# Patient Record
Sex: Female | Born: 1995 | Race: White | Hispanic: No | Marital: Single | Smoking: Never smoker
Health system: Southern US, Community
[De-identification: ages and names within clinical notes are randomized; demographics above are authoritative.]

## PROBLEM LIST (undated history)

## (undated) HISTORY — PX: LEG SURGERY: SHX1003

## (undated) HISTORY — PX: WISDOM TOOTH EXTRACTION: SHX21

---
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
## 2015-03-14 ENCOUNTER — Encounter: Payer: Self-pay | Admitting: Obstetrics & Gynecology

## 2015-03-14 ENCOUNTER — Ambulatory Visit (INDEPENDENT_AMBULATORY_CARE_PROVIDER_SITE_OTHER): Payer: 59 | Admitting: Obstetrics & Gynecology

## 2015-03-14 VITALS — BP 98/62 | HR 64 | Resp 16 | Ht 62.5 in | Wt 137.0 lb

## 2015-03-14 DIAGNOSIS — N938 Other specified abnormal uterine and vaginal bleeding: Secondary | ICD-10-CM | POA: Insufficient documentation

## 2015-03-14 MED ORDER — NORETHIN ACE-ETH ESTRAD-FE 1-20 MG-MCG PO TABS: 1.0000 | ORAL_TABLET | Freq: Every day | ORAL | Status: DC

## 2015-03-14 NOTE — Progress Notes (Signed)
19 y.o. G0P0000 SingleCaucasianF here for annual exam.  Going to [employer] in the fall.  Cycles have been irregular since she started having her cycle.  Has skipped every other month for several years.  Gardisil vaccine has been done.     Patient's last menstrual period was 03/04/2015.          Sexually active: No.  The current method of family planning is none.    Exercising: Yes.    cardio Smoker:  no  Health Maintenance: Pap:  none History of abnormal Pap:  no MMG:  none   reports that she has never smoked. She has never used smokeless tobacco. She reports that she does not drink alcohol or use illicit drugs.  No past medical history on file.  Past Surgical History  Procedure Laterality Date  . Wisdom tooth extraction    . Leg surgery      left leg x2-lateral tibia fracture-inserted screws and then removed the screws    Current Outpatient Prescriptions  Medication Sig Dispense Refill  . ibuprofen (ADVIL,MOTRIN) 200 MG tablet Take 200 mg by mouth once.       No current facility-administered medications for this visit.    Family History  Problem Relation Age of Onset  . Hypertension Maternal Grandmother   . Hypertension Maternal Grandfather   . Non-Hodgkin's lymphoma Paternal Grandfather   . Thyroid cancer Maternal Grandfather   . Diabetes Maternal Grandfather     borderline  . Atrial fibrillation Maternal Grandfather     ROS:  Pertinent items are noted in HPI.  Otherwise, a comprehensive ROS was negative.  Exam:   BP 98/62 mmHg  Pulse 64  Resp 16  Ht 5' 2.5" (1.588 m)  Wt 137 lb (62.143 kg)  BMI 24.64 kg/m2  LMP 03/04/2015   Height: 5' 2.5" (158.8 cm)  Ht Readings from Last 3 Encounters:  03/14/15 5' 2.5" (1.588 m) (25 %*, Z = -0.68)  07/26/11 5' 1.5" (1.562 m) (21 %*, Z = -0.81)   * Growth percentiles are based on CDC 2-20 Years data.   General appearance: alert, cooperative and appears stated age Head: Normocephalic, without obvious abnormality,  atraumatic Neck: no adenopathy, supple, symmetrical, trachea midline and thyroid normal to inspection and palpation Lungs: clear to auscultation bilaterally Heart: regular rate and rhythm Abdomen: soft, non-tender; bowel sounds normal; no masses,  no organomegaly Extremities: extremities normal, atraumatic, no cyanosis or edema Skin: Skin color, texture, turgor normal. No rashes or lesions Lymph nodes: Cervical, supraclavicular, and axillary nodes normal. No abnormal inguinal nodes palpated Neurologic: Grossly normal  Pelvic:  No exam today  A:  DUB with dysmenorrhea  P:   Loestrin 1/20 FE.  Rx to pharmcy.  Risks/side effects d/w pt and her mother including DVT/PE, headache, nausea, irregular bleeding.  Pt given new start instruction sheet and very specific reasons for calling discussed.  All questions answered.  Recheck 3 month

## 2015-05-02 ENCOUNTER — Telehealth: Payer: Self-pay | Admitting: Obstetrics & Gynecology

## 2015-06-13 ENCOUNTER — Ambulatory Visit: Payer: 59 | Admitting: Obstetrics & Gynecology

## 2015-06-14 ENCOUNTER — Ambulatory Visit (INDEPENDENT_AMBULATORY_CARE_PROVIDER_SITE_OTHER): Payer: 59 | Admitting: Obstetrics & Gynecology

## 2015-06-14 ENCOUNTER — Encounter: Payer: Self-pay | Admitting: Obstetrics & Gynecology

## 2015-06-14 VITALS — BP 106/62 | HR 60 | Resp 16 | Wt 142.0 lb

## 2015-06-14 DIAGNOSIS — N938 Other specified abnormal uterine and vaginal bleeding: Secondary | ICD-10-CM | POA: Diagnosis not present

## 2015-06-14 MED ORDER — NORETHIN ACE-ETH ESTRAD-FE 1-20 MG-MCG PO TABS: 1.0000 | ORAL_TABLET | Freq: Every day | ORAL | Status: DC

## 2015-06-14 NOTE — Progress Notes (Signed)
Patient ID: Haley Roth, female   DOB: [birth year], 19 y.o.   MRN: [id number]  19 y.o. Single Caucasian female G0P0000 here for follow up after starting OCPs.  Loestrin 1/20.  She is on her third month of the pill.  Cycles have been completely regular on the OCP.  Flow lasted about five days.  Flow was lighter and she didn't have any cramps.    No increased headaches and no increased nausea.  Moves back into school on August 15th.  Has already done orientation.  Will be on campus for the first year.  Going to [employer].   Very happy with change/improvement in cycles.  O: Healthy WD,WN female Affect:  normal  A:DUB that has resolved with OCPs Non-smoker  P: Rx for Loestin 1/20 given for year Pt aware needs AEX in 1 year.  Doesn't have schedule for next year.  Recall will be entered.

## 2015-07-16 ENCOUNTER — Telehealth: Payer: Self-pay | Admitting: Obstetrics & Gynecology

## 2015-07-16 NOTE — Telephone Encounter (Signed)
OK to switch to Loestrin 1.5/30 with next pack.  Have her call with any changes in symptoms, headache, visual changes, or if irregular bleeding continues.

## 2015-07-16 NOTE — Telephone Encounter (Signed)
Spoke with patient. Patient states that she has been on Loestrin 1/20 for four months now. Last month has her cycle during her second and fourth week of pill pack. Patient is in second week on pill pack this month and started her cycle and is having increased cramping. Patient has been taking medication daily without missing any pills. "The bleeding is light but I have really bad cramps.I had no problems at first and now I have two periods a month." Patient states that she has not been on any other form of OCP. Advised will speak with Dr.Miller regarding symptoms and return call with further recommendations. Patient is agreeable and verbalizes understanding.

## 2015-07-16 NOTE — Telephone Encounter (Signed)
Patient has questions about her birth control. °

## 2015-07-17 MED ORDER — NORETHINDRONE ACET-ETHINYL EST 1.5-30 MG-MCG PO TABS: 1.0000 | ORAL_TABLET | Freq: Every day | ORAL | Status: DC

## 2015-07-17 NOTE — Telephone Encounter (Signed)
Spoke with patient. Advised of message as seen below from Dr.Miller. Patient is agreeable. Patient would like to try new OCP. Rx for Loestrin 1.5/30 #3 4RF sent to pharmacy on file. Advised will need to complete this pack of OCP have her cycle and when she is due to start a new pack will switch to new OCP. Patient is agreeable. Will call with any headaches, vision changes, or if irregular bleeding continues.  Routing to provider for final review. Patient agreeable to disposition. Will close encounter.

## 2016-01-15 ENCOUNTER — Encounter: Payer: Self-pay | Admitting: Obstetrics & Gynecology

## 2016-04-13 ENCOUNTER — Encounter: Payer: Self-pay | Admitting: Obstetrics & Gynecology

## 2016-04-13 ENCOUNTER — Ambulatory Visit (INDEPENDENT_AMBULATORY_CARE_PROVIDER_SITE_OTHER): Payer: 59 | Admitting: Obstetrics & Gynecology

## 2016-04-13 VITALS — BP 94/58 | HR 54 | Resp 14 | Ht 62.0 in | Wt 138.0 lb

## 2016-04-13 DIAGNOSIS — Z0000 Encounter for general adult medical examination without abnormal findings: Secondary | ICD-10-CM | POA: Diagnosis not present

## 2016-04-13 DIAGNOSIS — Z01419 Encounter for gynecological examination (general) (routine) without abnormal findings: Secondary | ICD-10-CM

## 2016-04-13 LAB — POCT URINALYSIS DIPSTICK
BILIRUBIN UA: NEGATIVE
Blood, UA: NEGATIVE
Glucose, UA: NEGATIVE
KETONES UA: NEGATIVE
LEUKOCYTES UA: NEGATIVE
Nitrite, UA: NEGATIVE
PH UA: 7
Protein, UA: NEGATIVE
Urobilinogen, UA: NEGATIVE

## 2016-04-13 MED ORDER — NORETHIN-ETH ESTRAD-FE BIPHAS 1 MG-10 MCG / 10 MCG PO TABS: 1.0000 | ORAL_TABLET | Freq: Every day | ORAL | Status: DC

## 2016-04-13 NOTE — Progress Notes (Signed)
20 y.o. G0P0000 SingleCaucasianF here for annual exam.  Pt reports that she stopped the pill due to emotional changes.  She reports she has really good response from cramping improvement.  Flow and length of cycle was better too.  She stopped the pills without calling.    Would like to consider a breast reduction.  Having back issues.  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  Does a lot of tumbling with this and her back can really bother her at times.  Wears a DD right now.   SA.  First partner for both.    Did complete the gardisil vaccine.    Hope for the summer.  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  Starts the job June 6th.  There are 41100 year old twins that she will be work with this summer.  LMP: 03/25/2016           Sexually active: Yes.    The current method of family planning is none.    Exercising: Yes.    cardio and weights  Smoker:  no  Health Maintenance: Pap:  none History of abnormal Pap:  n/a MMG:  none Colonoscopy:  none BMD:   none TDaP:  12/01/2007 Screening Labs: declined, Hb today: declined, Urine today: pending   reports that she has never smoked. She has never used smokeless tobacco. She reports that she does not drink alcohol or use illicit drugs.  No past medical history on file.  Past Surgical History  Procedure Laterality Date  . Wisdom tooth extraction    . Leg surgery      left leg x2-lateral tibia fracture-inserted screws and then removed the screws    Family History  Problem Relation Age of Onset  . Hypertension Maternal Grandmother   . Hypertension Maternal Grandfather   . Non-Hodgkin's lymphoma Paternal Grandfather   . Thyroid cancer Maternal Grandfather   . Diabetes Maternal Grandfather     borderline  . Atrial fibrillation Maternal Grandfather     ROS:  Pertinent items are noted in HPI.  Otherwise, a comprehensive ROS was negative.  Exam:   General appearance: alert, cooperative and appears stated  age Head: Normocephalic, without obvious abnormality, atraumatic Neck: no adenopathy, supple, symmetrical, trachea midline and thyroid normal to inspection and palpation Lungs: clear to auscultation bilaterally Breasts: normal appearance, no masses or tenderness Heart: regular rate and rhythm Abdomen: soft, non-tender; bowel sounds normal; no masses,  no organomegaly Extremities: extremities normal, atraumatic, no cyanosis or edema Skin: Skin color, texture, turgor normal. No rashes or lesions Lymph nodes: Cervical, supraclavicular, and axillary nodes normal. No abnormal inguinal nodes palpated Neurologic: Grossly normal   Pelvic: No pelvic exam performed.  Chaperone was present for exam.  A:  Well Woman with normal exam DD breast size, considering reduction Dysmenorrhea Moodiness with Loestrin  P:   Mammogram screening discussed pap smear not indicated Trial of Loloestrin po q day.  Rx to pharmacy.  Pt to call if she still has mood change.  Will change progesterone in her OCPs. Names given for possible breast reduction return annually or prn

## 2016-04-13 NOTE — Patient Instructions (Signed)
Dr. Wayland Denislaire Sanger  Doctor in [place], [place]  Address: [address], [place], [place] [address]  Phone: [phone]  Teena Iraniavid M. Odis LusterBowers M.D.  Address: [address], [place], [place] [address]  Phone: [phone]  Hours:  Open today  8:30AM-5PM  Novella OliveWilliam Barber [address] - [address] [place], [place] [address] 8:30am - 5:30pm (M-Th)  8:30am-4pm [phone](Fri.)  [phone]  Fax - [phone]

## 2016-06-08 ENCOUNTER — Telehealth: Payer: Self-pay | Admitting: *Deleted

## 2016-06-08 NOTE — Telephone Encounter (Signed)
Pt needs to start with PCP.  If referral appropriate, will be done then.  I should not refer for something I haven't seen.  CC to triage to call pt, please.  Thank you.

## 2016-06-08 NOTE — Telephone Encounter (Signed)
Patient's mother called (ok per DPR) wanted us to see Haley Roth for a rash on legs and hands that started yesterday. Informed her she would need to see her PCP or dermatology for that. Patient's mother Erie NoeVanessa states she needs a referral for Dermatology so they can see her soon.   Please advise.   Pt's mother [phone]

## 2016-06-09 NOTE — Telephone Encounter (Signed)
Spoke with patient's mother Erie NoeVanessa, okay per ROI. Advised of message as seen below from Dr.Miller. Mother is agreeable and states that she took Anni to Urgent Care yesterday and she is currently being treated for her rash. She will follow up with her PCP if needed.  Routing to provider for final review. Patient agreeable to disposition. Will close encounter.

## 2016-07-07 ENCOUNTER — Telehealth: Payer: Self-pay | Admitting: Obstetrics & Gynecology

## 2016-07-07 NOTE — Telephone Encounter (Signed)
Patient wants to speak with the nurse. °

## 2016-07-07 NOTE — Telephone Encounter (Signed)
Patient has questions regarding her birth control

## 2016-07-07 NOTE — Telephone Encounter (Signed)
Return call to patient. Has changed contraceptive pills several times. Lo Loestrin is working but is $120 per month and needs to change. Savings card discussed and explained how to download online. Patient will try this and see if this works to make affordable. If this does not reduce cost, she will call back.  Routing to provider for final review. Patient agreeable to disposition. Will close encounter.

## 2016-07-07 NOTE — Telephone Encounter (Signed)
Spoke with patient. Patient states that she was unable to print off savings card for Lo Loestrin as recommended in previous call (please see other telephone note dated with today's date). Asking if our office can print one off and leave it at the front desk for her. Advised I will obtain savings card for her and place it at the front desk for pick up. She is agreeable. Savings card placed at the front desk with the patient's name and DOB.  Routing to provider for final review. Patient agreeable to disposition. Will close encounter.

## 2017-04-13 ENCOUNTER — Telehealth: Payer: Self-pay | Admitting: Obstetrics & Gynecology

## 2017-04-13 ENCOUNTER — Other Ambulatory Visit: Payer: Self-pay | Admitting: Obstetrics & Gynecology

## 2017-04-13 NOTE — Telephone Encounter (Signed)
Medication refill request: LO LOESTRIN  Last AEX:  04/13/16 SM Next AEX: 07/20/17  Last MMG (if hormonal medication request): n/a Refill authorized: 04/13/16 #1pack w/12 refills; today please advise

## 2017-04-13 NOTE — Telephone Encounter (Signed)
Patient has some questions for a nurse about her birth control.  °

## 2017-04-14 NOTE — Telephone Encounter (Signed)
Left message to call [facility] at [phone]. 

## 2017-04-20 ENCOUNTER — Encounter: Payer: Self-pay | Admitting: Obstetrics & Gynecology

## 2017-04-20 ENCOUNTER — Ambulatory Visit (INDEPENDENT_AMBULATORY_CARE_PROVIDER_SITE_OTHER): Payer: 59 | Admitting: Obstetrics & Gynecology

## 2017-04-20 VITALS — BP 100/78 | HR 84 | Resp 16 | Ht 61.5 in | Wt 140.0 lb

## 2017-04-20 DIAGNOSIS — Z01419 Encounter for gynecological examination (general) (routine) without abnormal findings: Secondary | ICD-10-CM

## 2017-04-20 MED ORDER — NORETHIN-ETH ESTRAD-FE BIPHAS 1 MG-10 MCG / 10 MCG PO TABS: 1.0000 | ORAL_TABLET | Freq: Every day | ORAL | 3 refills | Status: DC

## 2017-04-20 NOTE — Addendum Note (Signed)
Addended by: Jerene BearsMILLER, Bellah Alia S on: 04/20/2017 01:21 PM   Modules accepted: Level of Service

## 2017-04-20 NOTE — Progress Notes (Signed)
20 y.o. G0P0000 SingleCaucasianF here for annual exam.  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  Working in [place] with [employer] firm.  Is a business major right now.  Cycles are regular.  Flow lasts four days.  No days are heavy.    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  Breast pain and back pain is much better.  Has decided not to proceed with breast reduction due to improvement in back pain.  Patient's last menstrual period was 04/14/2017.          Sexually active: Yes.    The current method of family planning is OCP (estrogen/progesterone).    Exercising: Yes.    cardio Smoker:  no  Health Maintenance:  Pap:  Never  TDaP:  2009  Gardasil: Completed  Screening Labs: PCP   reports that she has never smoked. She has never used smokeless tobacco. She reports that she drinks alcohol. She reports that she does not use drugs.  History reviewed. No pertinent past medical history.  Past Surgical History:  Procedure Laterality Date  . LEG SURGERY     left leg x2-lateral tibia fracture-inserted screws and then removed the screws  . WISDOM TOOTH EXTRACTION      Current Outpatient Prescriptions  Medication Sig Dispense Refill  . ibuprofen (ADVIL,MOTRIN) 200 MG tablet Take 200 mg by mouth once.      . LO LOESTRIN FE 1 MG-10 MCG / 10 MCG tablet TAKE 1 TABLET BY MOUTH DAILY 28 tablet 3   No current facility-administered medications for this visit.     Family History  Problem Relation Age of Onset  . Hypertension Maternal Grandmother   . Hypertension Maternal Grandfather   . Thyroid cancer Maternal Grandfather   . Diabetes Maternal Grandfather        borderline  . Atrial fibrillation Maternal Grandfather   . Non-Hodgkin's lymphoma Paternal Grandfather     ROS:  Pertinent items are noted in HPI.  Otherwise, a comprehensive ROS was negative.  Exam:   BP 100/78 (BP Location: Right Arm, Patient Position: Sitting, Cuff Size: Normal)   Pulse 84   Resp 16   Ht 5' 1.5" (1.562 m)   Wt 140 lb  (63.5 kg)   LMP 04/14/2017   BMI 26.02 kg/m   Weight change: +2# Height: 5' 1.5" (156.2 cm)  Ht Readings from Last 3 Encounters:  04/20/17 5' 1.5" (1.562 m)  04/13/16 5\' 2"  (1.575 m) (19 %, Z= -0.90)*  03/14/15 5' 2.5" (1.588 m) (25 %, Z= -0.68)*   * Growth percentiles are based on CDC 2-20 Years data.   General appearance: alert, cooperative and appears stated age Head: Normocephalic, without obvious abnormality, atraumatic Neck: no adenopathy, supple, symmetrical, trachea midline and thyroid normal to inspection and palpation Lungs: clear to auscultation bilaterally Breasts: normal appearance, no masses or tenderness Heart: regular rate and rhythm Abdomen: soft, non-tender; bowel sounds normal; no masses,  no organomegaly Extremities: extremities normal, atraumatic, no cyanosis or edema Skin: Skin color, texture, turgor normal. No rashes or lesions Lymph nodes: Cervical, supraclavicular, and axillary nodes normal. No abnormal inguinal nodes palpated Neurologic: Grossly normal   Pelvic: Not exam done today.  Chaperone was present for exam.  A:  Well Woman with normal exam SA, on OCPs (has only had one partner and her partner has only had one partner so no STD testing done)  P:   Mammogram guidelines reviewed pap smear not indicated Rx for Loloestrin q day.  #3 month supply/4RF.  Savings coupon given. Return annually or prn

## 2017-04-28 NOTE — Telephone Encounter (Signed)
Left message to call Shina Wass at [phone]. 

## 2017-05-03 NOTE — Telephone Encounter (Signed)
Dr.Miller, I have attempted to reach this patient x2 with no return call. Okay to close encounter? 

## 2017-05-03 NOTE — Telephone Encounter (Signed)
Ok to close encounter. 

## 2017-07-20 ENCOUNTER — Ambulatory Visit: Payer: 59 | Admitting: Obstetrics & Gynecology

## 2017-11-17 ENCOUNTER — Telehealth: Payer: Self-pay | Admitting: Obstetrics & Gynecology

## 2017-11-17 NOTE — Telephone Encounter (Signed)
Spoke with patient, request to switch contraceptive method from OCP to IUD. States she stopped LoLoestrin a few months ago d/t "availabilty of prescription". States she had difficulty remembering to take pill d/t her schedule.   Denies any current gyn symptoms or complaints.    Last AEX 04/20/17, OV recommended to discuss IUD options. Patient scheduled for 12/03/17 at 8:30am with Dr. Hyacinth MeekerMiller. Patient verbalizes understanding and is agreeable.   Routing to provider for final review. Patient is agreeable to disposition. Will close encounter.

## 2017-11-17 NOTE — Telephone Encounter (Signed)
Patient is interested in changing her birth control method to an IUD.

## 2017-12-03 ENCOUNTER — Other Ambulatory Visit: Payer: Self-pay

## 2017-12-03 ENCOUNTER — Ambulatory Visit (INDEPENDENT_AMBULATORY_CARE_PROVIDER_SITE_OTHER): Payer: 59 | Admitting: Obstetrics & Gynecology

## 2017-12-03 ENCOUNTER — Encounter: Payer: Self-pay | Admitting: Obstetrics & Gynecology

## 2017-12-03 VITALS — BP 108/60 | HR 68 | Resp 14 | Ht 61.5 in | Wt 154.0 lb

## 2017-12-03 DIAGNOSIS — Z202 Contact with and (suspected) exposure to infections with a predominantly sexual mode of transmission: Secondary | ICD-10-CM

## 2017-12-03 DIAGNOSIS — Z30430 Encounter for insertion of intrauterine contraceptive device: Secondary | ICD-10-CM | POA: Diagnosis not present

## 2017-12-03 DIAGNOSIS — Z3009 Encounter for other general counseling and advice on contraception: Secondary | ICD-10-CM | POA: Diagnosis not present

## 2017-12-03 DIAGNOSIS — Z30014 Encounter for initial prescription of intrauterine contraceptive device: Secondary | ICD-10-CM

## 2017-12-03 LAB — POCT URINE PREGNANCY: Preg Test, Ur: NEGATIVE

## 2017-12-03 NOTE — Progress Notes (Addendum)
22 y.o. G0P0000 Single Caucasian female presents for discussion of contraception options.  Currently taking OCPs.  Occasionally misses a pill.  Is SA and would like to discuss longer-acting options.  Has done reading but not completely sure of desires.  Depo Provera, Implanon, and IUDs discussed.  Pt would like option that helps cycles so not interested in Paraguard IUD.  Different IUDs discussed.  Risks, benefits, procedures for initiation of different options discussed.  After discussion, feels Rutha BouchardKyleena is best option for her.  LMP 12/02/17.  Was unsure if anything could be done today but would like to proceed with changing methods if possible.  Pt aware STD testing will need to be done today.  Comfortable with this.  Consent is obtained today.  All questions answered prior to start of procedure.    Current contraception: OCPs Last STD testing: will be done today  LMP:  Patient's last menstrual period was 12/02/2017 (exact date).  Patient Active Problem List   Diagnosis Date Noted  . DUB (dysfunctional uterine bleeding) 03/14/2015   History reviewed. No pertinent past medical history. Current Outpatient Medications on File Prior to Visit  Medication Sig Dispense Refill  . fexofenadine (ALLEGRA) 60 MG tablet Take 60 mg by mouth daily as needed.    Marland Kitchen. ibuprofen (ADVIL,MOTRIN) 200 MG tablet Take 200 mg by mouth once.       No current facility-administered medications on file prior to visit.    Penicillins and Sulfa antibiotics  Review of Systems  All other systems reviewed and are negative.  Vitals:   12/03/17 0829  BP: 108/60  Pulse: 68  Resp: 14  Weight: 154 lb (69.9 kg)  Height: 5' 1.5" (1.562 m)    Gen:  WNWF healthy female NAD Abdomen: soft, non-tender Groin:  no inguinal nodes palpated  Pelvic exam: Vulva:  normal female genitalia Vagina:  normal vagina Cervix:  Non-tender, Negative CMT, no lesions or redness. GC/Chl testing obtained. Uterus:  normal shape, position and  consistency   Procedure:  Speculum reinserted.  Cervix visualized and cleansed with Betadine x 3.  Paracervical block was not placed.  Single toothed tenaculum applied to anterior lip of cervix without difficulty.  Uterus sounded to 7cm.  IUD package was opened. Kyleena IUD and introducer passed to fundus and then withdrawn slightly before IUD was passed into endometrial cavity.  Introducer removed.  Strings cut to 2cm.  Tenaculum removed from cervix.  Minimal bleeding noted.  Pt tolerated the procedure well.  All instruments removed from vagina.   Lot number: [id number]  Expiration:  11/19.  A: Insertion of Kyleena IUD Contraception desires GC/chl testing obtained today.   P:  Return for recheck 6-8 weeks Pt aware to call for any concerns Results for STD testing will be called to pt Pt aware removal due no later than 12/03/2022  IUD card given to pt.  Prior to placement, about 15 minutes spent in face to face discussion of options prior to deciding about Rutha BouchardKyleena IUD and proceeding with placement today.

## 2017-12-03 NOTE — Progress Notes (Deleted)
GYNECOLOGY  VISIT  CC:   ***  HPI: 22 y.o. G0P0000 Single Caucasian female here for contraception consult.  GYNECOLOGIC HISTORY: Patient's last menstrual period was 12/02/2017 (exact date). Contraception: condoms  Menopausal hormone therapy: none  Patient Active Problem List   Diagnosis Date Noted  . DUB (dysfunctional uterine bleeding) 03/14/2015    History reviewed. No pertinent past medical history.  Past Surgical History:  Procedure Laterality Date  . LEG SURGERY     left leg x2-lateral tibia fracture-inserted screws and then removed the screws  . WISDOM TOOTH EXTRACTION      MEDS:   Current Outpatient Medications on File Prior to Visit  Medication Sig Dispense Refill  . fexofenadine (ALLEGRA) 60 MG tablet Take 60 mg by mouth daily as needed.    Marland Kitchen. ibuprofen (ADVIL,MOTRIN) 200 MG tablet Take 200 mg by mouth once.       No current facility-administered medications on file prior to visit.     ALLERGIES: Penicillins and Sulfa antibiotics  Family History  Problem Relation Age of Onset  . Hypertension Maternal Grandmother   . Hypertension Maternal Grandfather   . Thyroid cancer Maternal Grandfather   . Diabetes Maternal Grandfather        borderline  . Atrial fibrillation Maternal Grandfather   . Non-Hodgkin's lymphoma Paternal Grandfather     SH:  ***  Review of Systems  All other systems reviewed and are negative.   PHYSICAL EXAMINATION:    BP 108/60 (BP Location: Right Arm, Patient Position: Sitting, Cuff Size: Normal)   Pulse 68   Resp 14   Ht 5' 1.5" (1.562 m)   Wt 154 lb (69.9 kg)   LMP 12/02/2017 (Exact Date)   BMI 28.63 kg/m     General appearance: alert, cooperative and appears stated age Neck: no adenopathy, supple, symmetrical, trachea midline and thyroid {CHL AMB PHY EX THYROID NORM DEFAULT:[phone]::"normal to inspection and palpation"} CV:  {Exam; heart brief:31539} Lungs:  {pe lungs ob:314451::"clear to auscultation, no wheezes, rales  or rhonchi, symmetric air entry"} Breasts: {Exam; breast:13139::"normal appearance, no masses or tenderness"} Abdomen: soft, non-tender; bowel sounds normal; no masses,  no organomegaly  Pelvic: External genitalia:  no lesions              Urethra:  normal appearing urethra with no masses, tenderness or lesions              Bartholins and Skenes: normal                 Vagina: normal appearing vagina with normal color and discharge, no lesions              Cervix: {CHL AMB PHY EX CERVIX NORM DEFAULT:[phone]::"no lesions"}              Bimanual Exam:  Uterus:  {CHL AMB PHY EX UTERUS NORM DEFAULT:[phone]::"normal size, contour, position, consistency, mobility, non-tender"}              Adnexa: {CHL AMB PHY EX ADNEXA NO MASS [phone]::"no mass, fullness, tenderness"}              Rectovaginal: {yes no:314532}.  Confirms.              Anus:  normal sphincter tone, no lesions  Chaperone was present for exam.  Assessment: ***  Plan: ***   ~{NUMBERS; -10-45 JOINT ROM:10287} minutes spent with patient >50% of time was in face to face discussion of above.

## 2017-12-04 LAB — GC/CHLAMYDIA PROBE AMP
Chlamydia trachomatis, NAA: NEGATIVE
Neisseria gonorrhoeae by PCR: NEGATIVE

## 2018-01-14 ENCOUNTER — Ambulatory Visit (INDEPENDENT_AMBULATORY_CARE_PROVIDER_SITE_OTHER): Payer: 59 | Admitting: Obstetrics & Gynecology

## 2018-01-14 ENCOUNTER — Encounter: Payer: Self-pay | Admitting: Obstetrics & Gynecology

## 2018-01-14 VITALS — BP 118/60 | HR 80 | Resp 16 | Wt 155.0 lb

## 2018-01-14 DIAGNOSIS — Z30431 Encounter for routine checking of intrauterine contraceptive device: Secondary | ICD-10-CM

## 2018-01-14 NOTE — Progress Notes (Signed)
GYNECOLOGY  VISIT  CC:   IUD recheck  HPI: 22 y.o. G0P0000 Single Caucasian female here for IUD check.  Kyleena IUD placed 12/03/2017.  Since that time she experienced some irregular spotting but reports this is not been bothersome.  She did cycle last week with fluid that was slightly heavier.  Overall this is much improved her previous cycle.  She has not been sexually active since IUD placement.  She has been here for her.  She has tried to feel her strings on one occasion and was successful.  GYNECOLOGIC HISTORY: Patient's last menstrual period was 12/03/2017. Contraception: Kyleena placed 12/03/17 Menopausal hormone therapy: none  There are no active problems to display for this patient.   No past medical history on file.  Past Surgical History:  Procedure Laterality Date  . LEG SURGERY     left leg x2-lateral tibia fracture-inserted screws and then removed the screws  . WISDOM TOOTH EXTRACTION      MEDS:   Current Outpatient Medications on File Prior to Visit  Medication Sig Dispense Refill  . fexofenadine (ALLEGRA) 60 MG tablet Take 60 mg by mouth daily as needed.    Marland Kitchen. ibuprofen (ADVIL,MOTRIN) 200 MG tablet Take 200 mg by mouth once.      . Levonorgestrel (KYLEENA) 19.5 MG IUD by Intrauterine route.     No current facility-administered medications on file prior to visit.     ALLERGIES: Penicillins and Sulfa antibiotics  Family History  Problem Relation Age of Onset  . Hypertension Maternal Grandmother   . Hypertension Maternal Grandfather   . Thyroid cancer Maternal Grandfather   . Diabetes Maternal Grandfather        borderline  . Atrial fibrillation Maternal Grandfather   . Non-Hodgkin's lymphoma Paternal Grandfather     SH: Single, non-smoker  Review of Systems  All other systems reviewed and are negative.   PHYSICAL EXAMINATION:    BP 118/60 (BP Location: Right Arm, Patient Position: Sitting, Cuff Size: Normal)   Pulse 80   Resp 16   Wt 155 lb (70.3  kg)   LMP 12/03/2017   BMI 28.81 kg/m     General appearance: alert, cooperative and appears stated age  Abdomen: soft, non-tender; bowel sounds normal; no masses,  no organomegaly  Pelvic: External genitalia:  no lesions              Urethra:  normal appearing urethra with no masses, tenderness or lesions              Bartholins and Skenes: normal                 Vagina: normal appearing vagina with normal color and discharge, no lesions              Cervix: no lesions and 2 cm IUD string noted              Bimanual Exam:  Uterus:  normal size, contour, position, consistency, mobility, non-tender              Adnexa: no mass, fullness, tenderness  Chaperone was present for exam.  Assessment: Status post insertion 12/03/17, doing well  Plan: Follow-up for routine annual exam patient is to call with any concerns or new issues.

## 2018-05-16 ENCOUNTER — Telehealth: Payer: Self-pay | Admitting: Obstetrics & Gynecology

## 2018-05-16 NOTE — Telephone Encounter (Signed)
Patient would like to speak with nurse about a weird feeling she has when she touches a particular spot on her stomach.

## 2018-05-16 NOTE — Telephone Encounter (Signed)
Spoke with patient. Patient reports pain at umbilicus, feels like pins and needs, painful to touch. Radiates to lower back. No skin changes, redness, edema or itching. Started 6/12.   IUD for contraceptive, LMP 6/14, states menses have been irregular with IUD, first on in 3 months. No currently SA, no STD concerns.  Clear vaginal d/c, no odor. Denies urinary complaints, fever/chills, N/V.   Recommended OV for further evaluation. Patient states she is living in [place] for the summer. Advised patient to seek care at local Urgent care/ER for further evaluation. Return call to office if f/u is needed with Dr. Hyacinth MeekerMiller. Advised Dr. Hyacinth MeekerMiller will review, I will return call with any additional recommendations.   Routing to provider for final review. Patient is agreeable to disposition. Will close encounter.

## 2018-07-14 ENCOUNTER — Ambulatory Visit: Payer: 59 | Admitting: Obstetrics & Gynecology

## 2018-07-14 ENCOUNTER — Encounter

## 2019-03-21 ENCOUNTER — Ambulatory Visit: Payer: 59 | Admitting: Obstetrics & Gynecology

## 2019-06-13 ENCOUNTER — Other Ambulatory Visit: Payer: Self-pay

## 2019-06-15 ENCOUNTER — Encounter: Payer: Self-pay | Admitting: Obstetrics & Gynecology

## 2019-06-15 ENCOUNTER — Ambulatory Visit (INDEPENDENT_AMBULATORY_CARE_PROVIDER_SITE_OTHER): Payer: 59 | Admitting: Obstetrics & Gynecology

## 2019-06-15 ENCOUNTER — Other Ambulatory Visit (HOSPITAL_COMMUNITY)
Admission: RE | Admit: 2019-06-15 | Discharge: 2019-06-15 | Disposition: A | Discharge status: Home / Self Care | Payer: 59 | Source: Ambulatory Visit | Attending: Obstetrics & Gynecology | Admitting: Obstetrics & Gynecology

## 2019-06-15 ENCOUNTER — Other Ambulatory Visit: Payer: Self-pay

## 2019-06-15 VITALS — BP 112/76 | HR 76 | Temp 97.2°F | Resp 12 | Ht 62.0 in | Wt 160.2 lb

## 2019-06-15 DIAGNOSIS — Z01419 Encounter for gynecological examination (general) (routine) without abnormal findings: Secondary | ICD-10-CM

## 2019-06-15 DIAGNOSIS — Z0000 Encounter for general adult medical examination without abnormal findings: Secondary | ICD-10-CM | POA: Diagnosis not present

## 2019-06-15 DIAGNOSIS — Z124 Encounter for screening for malignant neoplasm of cervix: Secondary | ICD-10-CM | POA: Insufficient documentation

## 2019-06-15 NOTE — Progress Notes (Signed)
23 y.o. G0P0000 Single White or Caucasian female here for annual exam.  Doing well.  She does cycle about every two or three months.  Flow is light and lasts about 5 days when it does occur.  She does have light cramping right before the months where she does have bleeding.    Moving to [place], [place], for work.  She will be working for [employer].    Patient's last menstrual period was 05/31/2019 (approximate).          Sexually active: Yes.    The current method of family planning is IUD--Kyleena.    Exercising: Yes.    cardio/running/walking. Smoker:  no  Health Maintenance: Pap:  Never History of abnormal Pap:  no TDaP:  10/2018 Gardasil: completed  Screening Labs: Desires today   reports that she has never smoked. She has never used smokeless tobacco. She reports current alcohol use. She reports that she does not use drugs.  History reviewed. No pertinent past medical history.  Past Surgical History:  Procedure Laterality Date  . LEG SURGERY     left leg x2-lateral tibia fracture-inserted screws and then removed the screws  . WISDOM TOOTH EXTRACTION      Current Outpatient Medications  Medication Sig Dispense Refill  . fexofenadine (ALLEGRA) 60 MG tablet Take 60 mg by mouth daily as needed.    Marland Kitchen ibuprofen (ADVIL,MOTRIN) 200 MG tablet Take 200 mg by mouth once.      . Levonorgestrel (KYLEENA) 19.5 MG IUD by Intrauterine route.     No current facility-administered medications for this visit.     Family History  Problem Relation Age of Onset  . Hypertension Maternal Grandmother   . Hypertension Maternal Grandfather   . Thyroid cancer Maternal Grandfather   . Diabetes Maternal Grandfather        borderline  . Atrial fibrillation Maternal Grandfather   . Non-Hodgkin's lymphoma Paternal Grandfather     Review of Systems  All other systems reviewed and are negative.   Exam:   BP 112/76 (BP Location: Left Arm, Cuff Size: Normal)   Pulse 76   Temp (!) 97.2 F (36.2 C)    Resp 12   Ht 5\' 2"  (1.575 m)   Wt 160 lb 3.2 oz (72.7 kg)   LMP 05/31/2019 (Approximate)   BMI 29.30 kg/m     Height: 5\' 2"  (157.5 cm)  Ht Readings from Last 3 Encounters:  06/15/19 5\' 2"  (1.575 m)  12/03/17 5' 1.5" (1.562 m)  04/20/17 5' 1.5" (1.562 m)    General appearance: alert, cooperative and appears stated age Head: Normocephalic, without obvious abnormality, atraumatic Neck: no adenopathy, supple, symmetrical, trachea midline and thyroid normal to inspection and palpation Lungs: clear to auscultation bilaterally Breasts: normal appearance, no masses or tenderness Heart: regular rate and rhythm Abdomen: soft, non-tender; bowel sounds normal; no masses,  no organomegaly Extremities: extremities normal, atraumatic, no cyanosis or edema Skin: Skin color, texture, turgor normal. No rashes or lesions Lymph nodes: Cervical, supraclavicular, and axillary nodes normal. No abnormal inguinal nodes palpated Neurologic: Grossly normal   Pelvic: External genitalia:  no lesions              Urethra:  normal appearing urethra with no masses, tenderness or lesions              Bartholins and Skenes: normal                 Vagina: normal appearing vagina with normal color and  discharge, no lesions              Cervix: no lesions, IUD string noted              Pap taken: Yes.   Bimanual Exam:  Uterus:  normal size, contour, position, consistency, mobility, non-tender              Adnexa: no mass, fullness, tenderness               Rectovaginal: Confirms               Anus:  normal sphincter tone, no lesions  Chaperone was present for exam.  A:  Well Woman with normal exam Kyleena IUD placed 12/03/2017  P:   Mammogram guidelines reviewed pap smear obtained today GC/Chl obtained today CBC, CMP, Lipids, TSH and Vit D Return annually or prn

## 2019-06-16 LAB — COMPREHENSIVE METABOLIC PANEL
ALT: 13 IU/L (ref 0–32)
AST: 19 IU/L (ref 0–40)
Albumin/Globulin Ratio: 2.1 (ref 1.2–2.2)
Albumin: 5 g/dL (ref 3.9–5.0)
Alkaline Phosphatase: 61 IU/L (ref 39–117)
BUN/Creatinine Ratio: 28 — ABNORMAL HIGH (ref 9–23)
BUN: 22 mg/dL — ABNORMAL HIGH (ref 6–20)
Bilirubin Total: 0.4 mg/dL (ref 0.0–1.2)
CO2: 20 mmol/L (ref 20–29)
Calcium: 10 mg/dL (ref 8.7–10.2)
Chloride: 103 mmol/L (ref 96–106)
Creatinine, Ser: 0.79 mg/dL (ref 0.57–1.00)
GFR calc Af Amer: 123 mL/min/{1.73_m2} (ref >59)
GFR calc non Af Amer: 107 mL/min/{1.73_m2} (ref >59)
Globulin, Total: 2.4 g/dL (ref 1.5–4.5)
Glucose: 85 mg/dL (ref 65–99)
Potassium: 4.5 mmol/L (ref 3.5–5.2)
Sodium: 141 mmol/L (ref 134–144)
Total Protein: 7.4 g/dL (ref 6.0–8.5)

## 2019-06-16 LAB — CBC
Hematocrit: 42.3 % (ref 34.0–46.6)
Hemoglobin: 14.7 g/dL (ref 11.1–15.9)
MCH: 30.7 pg (ref 26.6–33.0)
MCHC: 34.8 g/dL (ref 31.5–35.7)
MCV: 88 fL (ref 79–97)
Platelets: 393 10*3/uL (ref 150–450)
RBC: 4.79 x10E6/uL (ref 3.77–5.28)
RDW: 11.7 % (ref 11.7–15.4)
WBC: 9.5 10*3/uL (ref 3.4–10.8)

## 2019-06-16 LAB — LIPID PANEL
Chol/HDL Ratio: 2.5 ratio (ref 0.0–4.4)
Cholesterol, Total: 161 mg/dL (ref 100–199)
HDL: 64 mg/dL (ref >39)
LDL Calculated: 81 mg/dL (ref 0–99)
Triglycerides: 79 mg/dL (ref 0–149)
VLDL Cholesterol Cal: 16 mg/dL (ref 5–40)

## 2019-06-16 LAB — VITAMIN D 25 HYDROXY (VIT D DEFICIENCY, FRACTURES): Vit D, 25-Hydroxy: 43.7 ng/mL (ref 30.0–100.0)

## 2019-06-16 LAB — TSH: TSH: 1.48 u[IU]/mL (ref 0.450–4.500)

## 2019-06-19 ENCOUNTER — Encounter: Payer: Self-pay | Admitting: Obstetrics & Gynecology

## 2019-06-19 ENCOUNTER — Telehealth: Payer: Self-pay | Admitting: Obstetrics & Gynecology

## 2019-06-19 NOTE — Telephone Encounter (Signed)
Patient requesting results of cholesterol. Advised it was normal.

## 2019-06-19 NOTE — Telephone Encounter (Signed)
Patient sent the following correspondence through [facility]. Routing to triage to assist patient with request.  I have a question about CBC resulted on 06/16/19, 9:36 AM.    Hi Dr. Sabra Heck,     Which one of these is my cholesterol?     Thanks so much!     Haley Roth

## 2019-06-20 LAB — CYTOLOGY - PAP
Chlamydia: NEGATIVE
Diagnosis: NEGATIVE
Neisseria Gonorrhea: NEGATIVE

## 2019-07-31 ENCOUNTER — Encounter

## 2019-08-21 ENCOUNTER — Ambulatory Visit: Payer: 59 | Admitting: Obstetrics & Gynecology

## 2020-10-08 ENCOUNTER — Telehealth: Payer: Self-pay

## 2020-10-08 NOTE — Telephone Encounter (Signed)
Patient is calling in regards to having a UTI and wants a prescription called in.

## 2020-10-08 NOTE — Telephone Encounter (Signed)
AEX 05/2019 with SM Kyleena IUD placed 11/2017  Spoke with pt. Pt states having urinary frequency, urgency, burning since yesterday morning. Denies fever, chills, back pain. Pt is SA and symptoms started after intercourse. Pt states has never had UTI. Pt has been increasing water and taking cranberry juice. States has been helping, but not completely resolved sx. Pt advised to have OV for evaluation. Pt states living in [place] right now for work and would like to request medication for sx. Pt advised we do not treat over the phone and that Dr Hyacinth Meeker is out of office.  Pt advised to go to Urgent care or [facility] near her for evaluation and treatment. Pt agreeable and thankful for advice.  Encounter closed

## 2022-03-04 ENCOUNTER — Encounter (HOSPITAL_BASED_OUTPATIENT_CLINIC_OR_DEPARTMENT_OTHER): Payer: Self-pay
